# Patient Record
Sex: Male | Born: 1985 | Race: Black or African American | Hispanic: No | Marital: Single | Smoking: Current every day smoker
Health system: Southern US, Community
[De-identification: ages and names within clinical notes are randomized; demographics above are authoritative.]

## PROBLEM LIST (undated history)

## (undated) DIAGNOSIS — J45909 Unspecified asthma, uncomplicated: Secondary | ICD-10-CM

---
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
## 2006-02-02 ENCOUNTER — Emergency Department (HOSPITAL_COMMUNITY): Admission: EM | Admit: 2006-02-02 | Discharge: 2006-02-02 | Payer: Self-pay | Admitting: Family Medicine

## 2006-03-20 ENCOUNTER — Emergency Department (HOSPITAL_COMMUNITY): Admission: EM | Admit: 2006-03-20 | Discharge: 2006-03-20 | Payer: Self-pay | Admitting: Emergency Medicine

## 2007-06-02 ENCOUNTER — Emergency Department (HOSPITAL_COMMUNITY): Admission: EM | Admit: 2007-06-02 | Discharge: 2007-06-02 | Payer: Self-pay | Admitting: Emergency Medicine

## 2007-06-22 ENCOUNTER — Ambulatory Visit (HOSPITAL_COMMUNITY): Admission: RE | Admit: 2007-06-22 | Discharge: 2007-06-22 | Payer: Self-pay | Admitting: Orthopedic Surgery

## 2007-07-27 ENCOUNTER — Emergency Department (HOSPITAL_COMMUNITY): Admission: EM | Admit: 2007-07-27 | Discharge: 2007-07-27 | Payer: Self-pay | Admitting: Emergency Medicine

## 2008-07-22 ENCOUNTER — Emergency Department (HOSPITAL_COMMUNITY): Admission: EM | Admit: 2008-07-22 | Discharge: 2008-07-22 | Payer: Self-pay | Admitting: Emergency Medicine

## 2009-03-13 ENCOUNTER — Emergency Department (HOSPITAL_COMMUNITY): Admission: EM | Admit: 2009-03-13 | Discharge: 2009-03-13 | Payer: Self-pay | Admitting: Emergency Medicine

## 2009-03-15 ENCOUNTER — Emergency Department (HOSPITAL_COMMUNITY): Admission: EM | Admit: 2009-03-15 | Discharge: 2009-03-15 | Payer: Self-pay | Admitting: Emergency Medicine

## 2009-04-24 ENCOUNTER — Emergency Department (HOSPITAL_COMMUNITY): Admission: EM | Admit: 2009-04-24 | Discharge: 2009-04-24 | Payer: Self-pay | Admitting: Emergency Medicine

## 2010-04-26 LAB — GC/CHLAMYDIA PROBE AMP, GENITAL: GC Probe Amp, Genital: POSITIVE — AB
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
## 2010-07-08 ENCOUNTER — Emergency Department (HOSPITAL_COMMUNITY)
Admission: EM | Admit: 2010-07-08 | Discharge: 2010-07-08 | Disposition: A | Discharge status: Home / Self Care | Payer: Self-pay | Attending: Emergency Medicine | Admitting: Emergency Medicine

## 2010-07-08 DIAGNOSIS — H60399 Other infective otitis externa, unspecified ear: Secondary | ICD-10-CM | POA: Insufficient documentation

## 2010-07-11 ENCOUNTER — Emergency Department (HOSPITAL_COMMUNITY)
Admission: EM | Admit: 2010-07-11 | Discharge: 2010-07-11 | Disposition: A | Discharge status: Home / Self Care | Payer: Self-pay | Attending: Emergency Medicine | Admitting: Emergency Medicine

## 2010-07-11 DIAGNOSIS — L0201 Cutaneous abscess of face: Secondary | ICD-10-CM | POA: Insufficient documentation

## 2010-07-11 DIAGNOSIS — Z4801 Encounter for change or removal of surgical wound dressing: Secondary | ICD-10-CM | POA: Insufficient documentation

## 2010-07-11 DIAGNOSIS — L03211 Cellulitis of face: Secondary | ICD-10-CM | POA: Insufficient documentation

## 2010-10-14 LAB — BASIC METABOLIC PANEL
BUN: 10
CO2: 27
Calcium: 9.2
Creatinine, Ser: 1.25
GFR calc non Af Amer: >60
Glucose, Bld: 101 — ABNORMAL HIGH

## 2010-10-14 LAB — DIFFERENTIAL
Basophils Absolute: 0.1
Basophils Relative: 1
Eosinophils Absolute: 0.3
Eosinophils Relative: 2
Lymphocytes Relative: 38
Lymphs Abs: 4.1 — ABNORMAL HIGH
Monocytes Absolute: 0.8
Monocytes Relative: 8
Neutro Abs: 5.4
Neutrophils Relative %: 51

## 2010-10-14 LAB — BASIC METABOLIC PANEL WITH GFR
Chloride: 102
GFR calc Af Amer: >60
Potassium: 3.5
Sodium: 141

## 2010-10-14 LAB — POCT I-STAT, CHEM 8
BUN: 12
Calcium, Ion: 1.14
Chloride: 103
Creatinine, Ser: 1.5
Glucose, Bld: 101 — ABNORMAL HIGH
HCT: 45
Hemoglobin: 15.3
Potassium: 3.6
Sodium: 140
TCO2: 27

## 2010-10-14 LAB — CBC
HCT: 40.8
Hemoglobin: 14.1
MCHC: 34.5
MCV: 89.5
Platelets: 259
RBC: 4.55
RDW: 12.7
WBC: 10.6 — ABNORMAL HIGH

## 2010-10-15 LAB — CBC
HCT: 41.6
Platelets: 229
RBC: 4.71
WBC: 6.3

## 2010-10-15 LAB — BASIC METABOLIC PANEL
BUN: 11
Creatinine, Ser: 1.2
GFR calc Af Amer: >60
GFR calc non Af Amer: >60
Potassium: 3.9

## 2010-10-15 LAB — URINALYSIS, ROUTINE W REFLEX MICROSCOPIC
Bilirubin Urine: NEGATIVE
Hgb urine dipstick: NEGATIVE
Ketones, ur: NEGATIVE
Protein, ur: NEGATIVE
Urobilinogen, UA: 0.2

## 2010-10-15 LAB — DIFFERENTIAL
Lymphocytes Relative: 25
Lymphs Abs: 1.6
Neutrophils Relative %: 65

## 2010-10-15 LAB — TYPE AND SCREEN
ABO/RH(D): A NEG
Antibody Screen: NEGATIVE

## 2010-10-15 LAB — PROTIME-INR
INR: 0.9
Prothrombin Time: 12.8

## 2010-10-15 LAB — ABO/RH: ABO/RH(D): A NEG

## 2010-10-22 ENCOUNTER — Emergency Department (HOSPITAL_COMMUNITY)
Admission: EM | Admit: 2010-10-22 | Discharge: 2010-10-22 | Disposition: A | Discharge status: Home / Self Care | Payer: BC Managed Care – PPO | Attending: Emergency Medicine | Admitting: Emergency Medicine

## 2010-10-22 DIAGNOSIS — H60399 Other infective otitis externa, unspecified ear: Secondary | ICD-10-CM | POA: Insufficient documentation

## 2010-10-22 DIAGNOSIS — L0390 Cellulitis, unspecified: Secondary | ICD-10-CM | POA: Insufficient documentation

## 2010-10-22 DIAGNOSIS — L0291 Cutaneous abscess, unspecified: Secondary | ICD-10-CM | POA: Insufficient documentation

## 2011-09-07 ENCOUNTER — Encounter (HOSPITAL_COMMUNITY): Payer: Self-pay | Admitting: *Deleted

## 2011-09-07 ENCOUNTER — Emergency Department (INDEPENDENT_AMBULATORY_CARE_PROVIDER_SITE_OTHER)
Admission: EM | Admit: 2011-09-07 | Discharge: 2011-09-07 | Disposition: A | Discharge status: Home / Self Care | Payer: Self-pay | Source: Home / Self Care

## 2011-09-07 DIAGNOSIS — R52 Pain, unspecified: Secondary | ICD-10-CM

## 2011-09-07 DIAGNOSIS — L0291 Cutaneous abscess, unspecified: Secondary | ICD-10-CM

## 2011-09-07 HISTORY — DX: Unspecified asthma, uncomplicated: J45.909

## 2011-09-07 MED ORDER — IBUPROFEN 800 MG PO TABS: 800.0000 mg | ORAL_TABLET | Freq: Three times a day (TID) | ORAL | Status: AC

## 2011-09-07 MED ORDER — IBUPROFEN 800 MG PO TABS
ORAL_TABLET | ORAL | Status: AC
  Filled 2011-09-07: qty 1

## 2011-09-07 MED ORDER — IBUPROFEN 800 MG PO TABS
800.0000 mg | ORAL_TABLET | Freq: Once | ORAL | Status: AC
  Administered 2011-09-07: 800 mg via ORAL

## 2011-09-07 MED ORDER — CEPHALEXIN 500 MG PO CAPS: 500.0000 mg | ORAL_CAPSULE | Freq: Two times a day (BID) | ORAL | Status: AC

## 2011-09-07 NOTE — ED Provider Notes (Addendum)
History     CSN: [id number]  Arrival date & time 09/07/11  1108   None     Chief Complaint  Patient presents with  . Abscess    (Consider location/radiation/quality/duration/timing/severity/associated sxs/prior treatment) Patient is a 26 y.o. male presenting with abscess. The history is provided by the patient.  Abscess   This patient presents for evaluation of a cutaneous abscess.  The lesion is located in the right face.  Onset was 2 days ago.  Symptoms have worsen.  Abscess has associated symptoms of pain.  The patient does have previous history of cutaneous abscesses.  There is no known previous history of MRSA.   They do not have a history of diabetes. Has applied warm compresses and alcohol with no improvement.  Has had I&D of abscess in same area 6 months ago.    Past Medical History  Diagnosis Date  . Asthma     History reviewed. No pertinent past surgical history.  Family History  Problem Relation Age of Onset  . Asthma Maternal Aunt     History  Substance Use Topics  . Smoking status: Current Everyday Smoker  . Smokeless tobacco: Not on file  . Alcohol Use: Yes      Review of Systems  Constitutional: Negative.   Respiratory: Negative.   Cardiovascular: Negative.   Skin: Positive for wound. Negative for color change, pallor and rash.    Allergies  Review of patient's allergies indicates no known allergies.  Home Medications   Current Outpatient Rx  Name Route Sig Dispense Refill  . CEPHALEXIN 500 MG PO CAPS Oral Take 1 capsule (500 mg total) by mouth 2 (two) times daily. 20 capsule 0  . IBUPROFEN 800 MG PO TABS Oral Take 1 tablet (800 mg total) by mouth 3 (three) times daily. 21 tablet 0    BP 116/80  Pulse 80  Temp 98 F (36.7 C) (Oral)  Resp 14  SpO2 99%  Physical Exam  Nursing note and vitals reviewed. Constitutional: He is oriented to person, place, and time. Vital signs are normal. He appears well-developed and well-nourished.  He is active and cooperative.  HENT:  Head: Normocephalic.    Right Ear: Hearing, tympanic membrane, external ear and ear canal normal.  Left Ear: Hearing, tympanic membrane, external ear and ear canal normal.  Nose: Nose normal.  Mouth/Throat: Uvula is midline, oropharynx is clear and moist and mucous membranes are normal.       Abscess, approximately 2cm, fluctuant   Eyes: Conjunctivae are normal. Pupils are equal, round, and reactive to light. No scleral icterus.  Neck: Trachea normal. Neck supple.  Cardiovascular: Normal rate, regular rhythm, normal heart sounds and normal pulses.   Pulmonary/Chest: Effort normal and breath sounds normal.  Lymphadenopathy:       Head (right side): No submental, no submandibular, no tonsillar, no preauricular, no posterior auricular and no occipital adenopathy present.       Head (left side): No submental, no submandibular, no tonsillar, no preauricular, no posterior auricular and no occipital adenopathy present.    He has no cervical adenopathy.  Neurological: He is alert and oriented to person, place, and time. No cranial nerve deficit or sensory deficit.  Skin: Skin is warm and dry.  Psychiatric: He has a normal mood and affect. His speech is normal and behavior is normal. Judgment and thought content normal. Cognition and memory are normal.    ED Course  INCISION AND DRAINAGE Date/Time: 09/07/2011 12:11 PM Performed by: Nigel Mormon  Abbygale Lapid L Authorized by: Johnsie Kindred Consent: Verbal consent obtained. Risks and benefits: risks, benefits and alternatives were discussed Consent given by: patient Patient understanding: patient states understanding of the procedure being performed Patient consent: the patient's understanding of the procedure matches consent given Procedure consent: procedure consent matches procedure scheduled Relevant documents: relevant documents present and verified Patient identity confirmed: verbally with patient and arm  band Time out: Immediately prior to procedure a "time out" was called to verify the correct patient, procedure, equipment, support staff and site/side marked as required. Type: abscess Body area: head/neck Location details: face Anesthesia: local infiltration Local anesthetic: lidocaine 1% without epinephrine Anesthetic total: 2.5 ml Patient sedated: no Scalpel size: 11 Needle gauge: 22 Incision type: single straight (crosscut) Complexity: simple Drainage: purulent and serous Drainage amount: moderate Wound treatment: wound left open Packing material: none Patient tolerance: Patient tolerated the procedure well with no immediate complications.   (including critical care time)   Labs Reviewed  CULTURE, ROUTINE-ABSCESS   No results found.   1. Abscess   2. Pain       MDM  RTC in 4 days for wound recheck.    You have had an abscess drained.  An abscess is a collection of pus caused by infection with skin bacteria such as Streptococcus or Staphylococcus.    For the first 2 days, leave the dressing in place and keep it clean and dry.  If the abscess was packed, we may instruct you to come back in 2 days to have the packing removed and maybe replaced.  If the abscess was not packed, you may remove the dressing yourself in 2 days and take care of the wound yourself.  Finish up the prescription of any antibiotics that you have been given.  Take infectious precautions since the bacteria that cause these abscesses may be contagious.  Wash hands frequently or use hand sanitizer, especially after touching the abscess area or changing dressings.  Do not allow anyone else to use your towel or washcloth and wash these items after each use until the abscess has healed.  You may want to use an antibacterial soap such as Dial or a prescription like Hibiclens.  You also may want to consider spraying the tub or shower with a disinfectant such a Lysol until the abscess has healed.  Things  that should prompt you to return to the office for a recheck include:  Fever over 100 degrees, increasing pain or drainage, failure of the abscess to heal after 10 days, or other skin lesions elsewhere.        Johnsie Kindred, NP 09/07/11 1249    09/14/11 11:10 culture results show MRSA, attempted to call patient to change antibiotics, numbers listed were not working numbers, information given to [facility] for follow up.  Johnsie Kindred, NP 09/14/11 1112

## 2011-09-07 NOTE — ED Notes (Signed)
Dressing done by provider

## 2011-09-07 NOTE — ED Notes (Signed)
Pt reports painful, redness and swelling under his right ear the last week.

## 2011-09-07 NOTE — ED Provider Notes (Signed)
Medical screening examination/treatment/procedure(s) were performed by non-physician practitioner and as supervising physician I was immediately available for consultation/collaboration.  Jabaree Mercado, M.D.   Dereonna Lensing C Leroy Trim, MD 09/07/11 2017 

## 2011-09-07 NOTE — Discharge Instructions (Signed)
You have had an abscess drained.  An abscess is a collection of pus caused by infection with skin bacteria such as Streptococcus or Staphylococcus.    For the first 2 days, leave the dressing in place and keep it clean and dry.  If the abscess was packed, we may instruct you to come back in 2 days to have the packing removed and maybe replaced.  If the abscess was not packed, you may remove the dressing yourself in 2 days and take care of the wound yourself.  Finish up the prescription of any antibiotics that you have been given.  Take infectious precautions since the bacteria that cause these abscesses may be contagious.  Wash hands frequently or use hand sanitizer, especially after touching the abscess area or changing dressings.  Do not allow anyone else to use your towel or washcloth and wash these items after each use until the abscess has healed.  You may want to use an antibacterial soap such as Dial or a prescription like Hibiclens.  You also may want to consider spraying the tub or shower with a disinfectant such a Lysol until the abscess has healed.  Things that should prompt you to return to the office for a recheck include:  Fever over 100 degrees, increasing pain or drainage, failure of the abscess to heal after 10 days, or other skin lesions elsewhere.   Abscess An abscess (boil or furuncle) is an infected area under your skin. This area is filled with yellowish white fluid (pus). HOME CARE   Only take medicine as told by your doctor.   Keep the skin clean around your abscess. Keep clothes that may touch the abscess clean.   Change any bandages (dressings) as told by your doctor.   Avoid direct skin contact with other people. The infection can spread by skin contact with others.   Practice good hygiene and do not share personal care items.   Do not share athletic equipment, towels, or whirlpools. Shower after every practice or work out session.   If a draining area cannot be  covered:   Do not play sports.   Children should not go to daycare until the wound has healed or until fluid (drainage) stops coming out of the wound.   See your doctor for a follow-up visit as told.  GET HELP RIGHT AWAY IF:   There is more pain, puffiness (swelling), and redness in the wound site.   There is fluid or bleeding from the wound site.   You have muscle aches, chills, fever, or feel sick.   You or your child has a temperature by mouth above 102 F (38.9 C), not controlled by medicine.   Your baby is older than 3 months with a rectal temperature of 102 F (38.9 C) or higher.  MAKE SURE YOU:   Understand these instructions.   Will watch your condition.   Will get help right away if you are not doing well or get worse.  Document Released: 06/23/2007 Document Revised: 12/24/2010 Document Reviewed: 06/23/2007 [facility] Patient Information 2012 [place], [place].Skin Infections A skin infection usually develops as a result of disruption of the skin barrier.  CAUSES  A skin infection might occur following:  Trauma or an injury to the skin such as a cut or insect sting.   Inflammation (as in eczema).   Breaks in the skin between the toes (as in athlete's foot).   Swelling (edema).  SYMPTOMS  The legs are the most common site  affected. Usually there is:  Redness.   Swelling.   Pain.   There may be red streaks in the area of the infection.  TREATMENT   Minor skin infections may be treated with topical antibiotics, but if the skin infection is severe, hospital care and intravenous (IV) antibiotic treatment may be needed.   Most often skin infections can be treated with oral antibiotic medicine as well as proper rest and elevation of the affected area until the infection improves.   If you are prescribed oral antibiotics, it is important to take them as directed and to take all the pills even if you feel better before you have finished all of the medicine.   You  may apply warm compresses to the area for 20-30 minutes 4 times daily.  You might need a tetanus shot now if:  You have no idea when you had the last one.   You have never had a tetanus shot before.   Your wound had dirt in it.  If you need a tetanus shot and you decide not to get one, there is a rare chance of getting tetanus. Sickness from tetanus can be serious. If you get a tetanus shot, your arm may swell and become red and warm at the shot site. This is common and not a problem. SEEK MEDICAL CARE IF:  The pain and swelling from your infection do not improve within 2 days.  SEEK IMMEDIATE MEDICAL CARE IF:  You develop a fever, chills, or other serious problems.  Document Released: 02/12/2004 Document Revised: 12/24/2010 Document Reviewed: 12/25/2007 [facility] Patient Information 2012 [place], [place].

## 2011-09-11 LAB — CULTURE, ROUTINE-ABSCESS

## 2011-09-14 ENCOUNTER — Telehealth (HOSPITAL_COMMUNITY): Payer: Self-pay | Admitting: *Deleted

## 2011-09-14 NOTE — ED Provider Notes (Signed)
Medical screening examination/treatment/procedure(s) were performed by non-physician practitioner and as supervising physician I was immediately available for consultation/collaboration.  Leslee Home, M.D.   Reuben Likes, MD 09/14/11 [phone]

## 2011-09-14 NOTE — ED Notes (Signed)
Abscess culture R face: Mod. MRSA.  Pt. treated with Keflex.  Porfirio Mylar reviewed the lab and changed Rx. to Doxycycline 100 mg. PO BID x 10 days. She attempted to contact pt. and said both numbers are not valid.  I called pt.'s contact mother and left a message for pt. to call. Vassie Moselle 09/14/2011

## 2011-09-20 ENCOUNTER — Telehealth (HOSPITAL_COMMUNITY): Payer: Self-pay | Admitting: *Deleted

## 2011-09-20 NOTE — ED Notes (Signed)
I left a message on Mom's phone for pt. to call.  Call 2. Shane Logan 09/20/2011

## 2011-09-23 ENCOUNTER — Telehealth (HOSPITAL_COMMUNITY): Payer: Self-pay | Admitting: *Deleted

## 2011-09-23 NOTE — ED Notes (Signed)
Mom contacted today. She said she did not get my other 2 messages.  States her son's phone is turned off.  She will give him a message to call tomorrow after 1130.

## 2011-09-28 ENCOUNTER — Telehealth (HOSPITAL_COMMUNITY): Payer: Self-pay | Admitting: *Deleted

## 2011-09-28 NOTE — ED Notes (Signed)
Unable to contact pt. by phone.  Letter sent asking pt. to call for further treatment. Shane Logan 09/28/2011

## 2013-09-12 ENCOUNTER — Emergency Department (HOSPITAL_COMMUNITY)
Admission: EM | Admit: 2013-09-12 | Discharge: 2013-09-12 | Disposition: A | Discharge status: Home / Self Care | Payer: No Typology Code available for payment source | Attending: Family Medicine | Admitting: Family Medicine

## 2013-09-12 ENCOUNTER — Encounter (HOSPITAL_COMMUNITY): Payer: Self-pay | Admitting: Emergency Medicine

## 2013-09-12 DIAGNOSIS — J45909 Unspecified asthma, uncomplicated: Secondary | ICD-10-CM | POA: Insufficient documentation

## 2013-09-12 DIAGNOSIS — S239XXA Sprain of unspecified parts of thorax, initial encounter: Secondary | ICD-10-CM | POA: Diagnosis not present

## 2013-09-12 DIAGNOSIS — Y9389 Activity, other specified: Secondary | ICD-10-CM | POA: Insufficient documentation

## 2013-09-12 DIAGNOSIS — S134XXA Sprain of ligaments of cervical spine, initial encounter: Secondary | ICD-10-CM

## 2013-09-12 DIAGNOSIS — IMO0002 Reserved for concepts with insufficient information to code with codable children: Secondary | ICD-10-CM | POA: Diagnosis present

## 2013-09-12 DIAGNOSIS — Y92410 Unspecified street and highway as the place of occurrence of the external cause: Secondary | ICD-10-CM | POA: Insufficient documentation

## 2013-09-12 DIAGNOSIS — F17200 Nicotine dependence, unspecified, uncomplicated: Secondary | ICD-10-CM | POA: Insufficient documentation

## 2013-09-12 MED ORDER — IBUPROFEN 800 MG PO TABS: 800.0000 mg | ORAL_TABLET | Freq: Three times a day (TID) | ORAL | Status: DC | PRN

## 2013-09-12 MED ORDER — CYCLOBENZAPRINE HCL 10 MG PO TABS: 10.0000 mg | ORAL_TABLET | Freq: Three times a day (TID) | ORAL | Status: AC | PRN

## 2013-09-12 MED ORDER — HYDROCODONE-ACETAMINOPHEN 5-325 MG PO TABS: 1.0000 | ORAL_TABLET | Freq: Four times a day (QID) | ORAL | Status: DC | PRN
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
## 2013-09-12 NOTE — Discharge Instructions (Signed)
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
## 2013-09-13 NOTE — ED Provider Notes (Signed)
Medical screening examination/treatment/procedure(s) were performed by a resident physician or non-physician practitioner and as the supervising physician I was immediately available for consultation/collaboration.  Madiha Bambrick, MD Family Medicine   Madelyne Millikan J Joban Colledge, MD 09/13/13 1138 

## 2013-12-06 ENCOUNTER — Emergency Department (HOSPITAL_COMMUNITY)
Admission: EM | Admit: 2013-12-06 | Discharge: 2013-12-06 | Disposition: A | Discharge status: Home / Self Care | Payer: Self-pay | Attending: Emergency Medicine | Admitting: Emergency Medicine

## 2013-12-06 ENCOUNTER — Emergency Department (HOSPITAL_COMMUNITY): Payer: Self-pay

## 2013-12-06 ENCOUNTER — Encounter (HOSPITAL_COMMUNITY): Payer: Self-pay | Admitting: Emergency Medicine

## 2013-12-06 DIAGNOSIS — J45909 Unspecified asthma, uncomplicated: Secondary | ICD-10-CM | POA: Insufficient documentation

## 2013-12-06 DIAGNOSIS — Z720 Tobacco use: Secondary | ICD-10-CM | POA: Insufficient documentation

## 2013-12-06 DIAGNOSIS — R52 Pain, unspecified: Secondary | ICD-10-CM

## 2013-12-06 DIAGNOSIS — N451 Epididymitis: Secondary | ICD-10-CM | POA: Insufficient documentation

## 2013-12-06 LAB — URINALYSIS, ROUTINE W REFLEX MICROSCOPIC
Bilirubin Urine: NEGATIVE
GLUCOSE, UA: NEGATIVE mg/dL
HGB URINE DIPSTICK: NEGATIVE
KETONES UR: NEGATIVE mg/dL
LEUKOCYTES UA: NEGATIVE
Nitrite: NEGATIVE
PH: 6.5 (ref 5.0–8.0)
PROTEIN: NEGATIVE mg/dL
Specific Gravity, Urine: 1.021 (ref 1.005–1.030)
Urobilinogen, UA: 1 mg/dL (ref 0.0–1.0)

## 2013-12-06 MED ORDER — HYDROCODONE-ACETAMINOPHEN 5-325 MG PO TABS: 1.0000 | ORAL_TABLET | Freq: Four times a day (QID) | ORAL | Status: DC | PRN

## 2013-12-06 MED ORDER — CEFTRIAXONE SODIUM 250 MG IJ SOLR
250.0000 mg | Freq: Once | INTRAMUSCULAR | Status: AC
  Administered 2013-12-06: 250 mg via INTRAMUSCULAR
  Filled 2013-12-06: qty 250

## 2013-12-06 MED ORDER — DOXYCYCLINE HYCLATE 100 MG PO TABS
100.0000 mg | ORAL_TABLET | Freq: Once | ORAL | Status: AC
  Administered 2013-12-06: 100 mg via ORAL
  Filled 2013-12-06 (×2): qty 1

## 2013-12-06 MED ORDER — DOXYCYCLINE HYCLATE 100 MG PO CAPS
100.0000 mg | ORAL_CAPSULE | Freq: Two times a day (BID) | ORAL | Status: DC
Start: 2013-12-06 — End: 2015-02-21

## 2013-12-06 NOTE — ED Notes (Signed)
Pt reports right scrotum swelling and pain (7/10). Denies injury.

## 2013-12-06 NOTE — ED Provider Notes (Addendum)
CSN: [id number]     Arrival date & time 12/06/13  1219 History   First MD Initiated Contact with Patient 12/06/13 1357     Chief Complaint  Patient presents with  . Groin Swelling    right     (Consider location/radiation/quality/duration/timing/severity/associated sxs/prior Treatment) Patient is a 28 y.o. male presenting with testicular pain. The history is provided by the patient.  Testicle Pain This is a new problem. The current episode started yesterday. The problem occurs constantly. The problem has not changed since onset.Associated symptoms comments: Pain and swelling of the right testicle starting yesterday.  No fever, abd pain, discharge, dysuria or penile pain/rashes. The symptoms are aggravated by walking. Nothing relieves the symptoms. He has tried nothing for the symptoms. The treatment provided no relief.    Past Medical History  Diagnosis Date  . Asthma    No past surgical history on file. Family History  Problem Relation Age of Onset  . Asthma Maternal Aunt    History  Substance Use Topics  . Smoking status: Current Every Day Smoker  . Smokeless tobacco: Not on file  . Alcohol Use: Yes    Review of Systems  Genitourinary: Positive for testicular pain.  All other systems reviewed and are negative.     Allergies  Review of patient's allergies indicates no known allergies.  Home Medications   Prior to Admission medications   Medication Sig Start Date End Date Taking? Authorizing Provider  ibuprofen (ADVIL,MOTRIN) 800 MG tablet Take 1 tablet (800 mg total) by mouth 3 (three) times daily as needed. 09/12/13  Yes Graylon GoodZachary H Baker, PA-C  cyclobenzaprine (FLEXERIL) 10 MG tablet Take 1 tablet (10 mg total) by mouth 3 (three) times daily as needed for muscle spasms. Patient not taking: Reported on 12/06/2013 09/12/13   Graylon GoodZachary H Baker, PA-C  HYDROcodone-acetaminophen (NORCO) 5-325 MG per tablet Take 1 tablet by mouth every 6 (six) hours as needed for moderate  pain. Patient not taking: Reported on 12/06/2013 09/12/13   Graylon GoodZachary H Baker, PA-C   BP 134/93 mmHg  Pulse 77  Temp(Src) 97.8 F (36.6 C) (Oral)  Resp 21  SpO2 99% Physical Exam  Constitutional: He is oriented to person, place, and time. He appears well-developed and well-nourished. No distress.  HENT:  Head: Normocephalic and atraumatic.  Cardiovascular: Normal rate.   Pulmonary/Chest: Effort normal.  Abdominal: Soft. He exhibits no distension. There is no tenderness. There is no rebound and no guarding.  Genitourinary: Penis normal.    Right testis shows swelling and tenderness. Left testis shows no mass, no swelling and no tenderness. Left testis is descended. Cremasteric reflex is not absent on the left side.  Neurological: He is alert and oriented to person, place, and time.  Skin: Skin is warm and dry.  Psychiatric: He has a normal mood and affect. His behavior is normal.  Nursing note and vitals reviewed.   ED Course  Procedures (including critical care time) Labs Review Labs Reviewed  URINALYSIS, ROUTINE W REFLEX MICROSCOPIC    Imaging Review [place] Scrotum  12/06/2013   CLINICAL DATA:  Right-sided testicular pain and swelling  EXAM: SCROTAL ULTRASOUND  DOPPLER ULTRASOUND OF THE TESTICLES  TECHNIQUE: Complete ultrasound examination of the testicles, epididymis, and other scrotal structures was performed. Color and spectral Doppler ultrasound were also utilized to evaluate blood flow to the testicles.  COMPARISON:  None.  FINDINGS: Right testicle  Measurements: 5.4 x 2.7 x 3.5 cm. No mass identified. Microlithiasis noted.  Left testicle  Measurements: 4.1 x 2.0 x 4.0 cm. No mass identified. Microlithiasis noted.  Right epididymis: Mild swelling and hyperemia seen involving tail of right epididymis. This is suspicious for mild epididymitis.  Left epididymis:  Normal in size and appearance.  Hydrocele:  None visualized.  Varicocele:  Small bilateral varicoceles noted.  Pulsed  Doppler interrogation of both testes demonstrates low resistance arterial and venous waveforms bilaterally. Color Doppler ultrasound shows increased blood flow within the right testicle compared to the left testicle.  IMPRESSION: No evidence of testicular mass or torsion.  Findings suspicious for mild right-sided epididymitis. Possible right-sided orchitis versus reactive hyperemia.  Bilateral testicular microlithiasis. Current literature suggests that testicular microlithiasis is not a significant independent risk factor for development of testicular carcinoma, and that follow up imaging is not warranted in the absence of other risk factors. Monthly testicular self-examination and annual physical exams are considered appropriate surveillance. If patient has other risk factors for testicular carcinoma, then referral to Urology should be considered. (Reference: DeCastro, et al.: A 5-Year Follow up Study of Asymptomatic Men with Testicular Microlithiasis. J Urol 2008; 179:1420-1423.)   Electronically Signed   By: Myles RosenthalJohn  Stahl M.D.   On: 12/06/2013 15:23   [place] Art/ven Flow Abd Pelv Doppler  12/06/2013   CLINICAL DATA:  Right-sided testicular pain and swelling  EXAM: SCROTAL ULTRASOUND  DOPPLER ULTRASOUND OF THE TESTICLES  TECHNIQUE: Complete ultrasound examination of the testicles, epididymis, and other scrotal structures was performed. Color and spectral Doppler ultrasound were also utilized to evaluate blood flow to the testicles.  COMPARISON:  None.  FINDINGS: Right testicle  Measurements: 5.4 x 2.7 x 3.5 cm. No mass identified. Microlithiasis noted.  Left testicle  Measurements: 4.1 x 2.0 x 4.0 cm. No mass identified. Microlithiasis noted.  Right epididymis: Mild swelling and hyperemia seen involving tail of right epididymis. This is suspicious for mild epididymitis.  Left epididymis:  Normal in size and appearance.  Hydrocele:  None visualized.  Varicocele:  Small bilateral varicoceles noted.  Pulsed Doppler  interrogation of both testes demonstrates low resistance arterial and venous waveforms bilaterally. Color Doppler ultrasound shows increased blood flow within the right testicle compared to the left testicle.  IMPRESSION: No evidence of testicular mass or torsion.  Findings suspicious for mild right-sided epididymitis. Possible right-sided orchitis versus reactive hyperemia.  Bilateral testicular microlithiasis. Current literature suggests that testicular microlithiasis is not a significant independent risk factor for development of testicular carcinoma, and that follow up imaging is not warranted in the absence of other risk factors. Monthly testicular self-examination and annual physical exams are considered appropriate surveillance. If patient has other risk factors for testicular carcinoma, then referral to Urology should be considered. (Reference: DeCastro, et al.: A 5-Year Follow up Study of Asymptomatic Men with Testicular Microlithiasis. J Urol 2008; 179:1420-1423.)   Electronically Signed   By: Myles RosenthalJohn  Stahl M.D.   On: 12/06/2013 15:23     EKG Interpretation None      MDM   Final diagnoses:  Epididymitis    Patient with symptoms consistent with epididymitis today. Low suspicion for torsion. Scrotal ultrasound pending. Patient denies any sexual activity in the last 1-2 months. He denies any penile discharge or rashes.  3:35 PM Ultrasound consistent with right epididymitis. Will treat with doxycycline and rocephin.  Gwyneth SproutWhitney Britt Theard, MD 12/06/13 1537  Gwyneth SproutWhitney Inetta Dicke, MD 12/06/13 1540  Gwyneth SproutWhitney Joanna Hall, MD 12/06/13 1545

## 2015-02-21 ENCOUNTER — Encounter (HOSPITAL_COMMUNITY): Payer: Self-pay

## 2015-02-21 ENCOUNTER — Emergency Department (HOSPITAL_COMMUNITY)
Admission: EM | Admit: 2015-02-21 | Discharge: 2015-02-21 | Disposition: A | Discharge status: Home / Self Care | Payer: Self-pay | Attending: Physician Assistant | Admitting: Physician Assistant

## 2015-02-21 DIAGNOSIS — R110 Nausea: Secondary | ICD-10-CM | POA: Insufficient documentation

## 2015-02-21 DIAGNOSIS — R1013 Epigastric pain: Secondary | ICD-10-CM | POA: Insufficient documentation

## 2015-02-21 DIAGNOSIS — F17200 Nicotine dependence, unspecified, uncomplicated: Secondary | ICD-10-CM | POA: Insufficient documentation

## 2015-02-21 DIAGNOSIS — J45909 Unspecified asthma, uncomplicated: Secondary | ICD-10-CM | POA: Insufficient documentation

## 2015-02-21 LAB — COMPREHENSIVE METABOLIC PANEL
ALT: 44 U/L (ref 17–63)
ANION GAP: 10 (ref 5–15)
AST: 35 U/L (ref 15–41)
Albumin: 4.5 g/dL (ref 3.5–5.0)
Alkaline Phosphatase: 84 U/L (ref 38–126)
BUN: 7 mg/dL (ref 6–20)
CHLORIDE: 105 mmol/L (ref 101–111)
CO2: 24 mmol/L (ref 22–32)
CREATININE: 1.34 mg/dL — AB (ref 0.61–1.24)
Calcium: 9.6 mg/dL (ref 8.9–10.3)
Glucose, Bld: 107 mg/dL — ABNORMAL HIGH (ref 65–99)
POTASSIUM: 3.9 mmol/L (ref 3.5–5.1)
SODIUM: 139 mmol/L (ref 135–145)
Total Bilirubin: 1.7 mg/dL — ABNORMAL HIGH (ref 0.3–1.2)
Total Protein: 8 g/dL (ref 6.5–8.1)

## 2015-02-21 LAB — LIPASE, BLOOD: LIPASE: 43 U/L (ref 11–51)

## 2015-02-21 LAB — CBC
HEMATOCRIT: 45.7 % (ref 39.0–52.0)
HEMOGLOBIN: 15 g/dL (ref 13.0–17.0)
MCH: 30.5 pg (ref 26.0–34.0)
MCHC: 32.8 g/dL (ref 30.0–36.0)
MCV: 92.9 fL (ref 78.0–100.0)
PLATELETS: 304 10*3/uL (ref 150–400)
RBC: 4.92 MIL/uL (ref 4.22–5.81)
RDW: 12.7 % (ref 11.5–15.5)
WBC: 9.9 10*3/uL (ref 4.0–10.5)

## 2015-02-21 MED ORDER — PANTOPRAZOLE SODIUM 40 MG IV SOLR
40.0000 mg | INTRAVENOUS | Status: AC
  Administered 2015-02-21: 40 mg via INTRAVENOUS
  Filled 2015-02-21: qty 40

## 2015-02-21 MED ORDER — OMEPRAZOLE 20 MG PO CPDR: 20.0000 mg | DELAYED_RELEASE_CAPSULE | Freq: Every day | ORAL | Status: AC

## 2015-02-21 MED ORDER — GI COCKTAIL ~~LOC~~
30.0000 mL | Freq: Once | ORAL | Status: AC
Start: 2015-02-21 — End: 2015-02-21
  Administered 2015-02-21: 30 mL via ORAL
  Filled 2015-02-21: qty 30

## 2015-02-21 MED ORDER — SUCRALFATE 1 GM/10ML PO SUSP: 1.0000 g | Freq: Three times a day (TID) | ORAL | Status: AC

## 2015-02-21 NOTE — Discharge Instructions (Signed)
Peptic Ulcer ° °A peptic ulcer is a sore in the lining of your esophagus (esophageal ulcer), stomach (gastric ulcer), or in the first part of your small intestine (duodenal ulcer). The ulcer causes erosion into the deeper tissue. °CAUSES  °Normally, the lining of the stomach and the small intestine protects itself from the acid that digests food. The protective lining can be damaged by: °· An infection caused by a bacterium called Helicobacter pylori (H. pylori). °· Regular use of nonsteroidal anti-inflammatory drugs (NSAIDs), such as ibuprofen or aspirin. °· Smoking tobacco. °Other risk factors include being older than 50, drinking alcohol excessively, and having a family history of ulcer disease.  °SYMPTOMS  °· Burning pain or gnawing in the area between the chest and the belly button. °· Heartburn. °· Nausea and vomiting. °· Bloating. °The pain can be worse on an empty stomach and at night. If the ulcer results in bleeding, it can cause: °· Black, tarry stools. °· Vomiting of bright red blood. °· Vomiting of coffee-ground-looking materials. °DIAGNOSIS  °A diagnosis is usually made based upon your history and an exam. Other tests and procedures may be performed to find the cause of the ulcer. Finding a cause will help determine the best treatment. Tests and procedures may include: °· Blood tests, stool tests, or breath tests to check for the bacterium H. pylori. °· An upper gastrointestinal (GI) series of the esophagus, stomach, and small intestine. °· An endoscopy to examine the esophagus, stomach, and small intestine. °· A biopsy. °TREATMENT  °Treatment may include: °· Eliminating the cause of the ulcer, such as smoking, NSAIDs, or alcohol. °· Medicines to reduce the amount of acid in your digestive tract. °· Antibiotic medicines if the ulcer is caused by the H. pylori bacterium. °· An upper endoscopy to treat a bleeding ulcer. °· Surgery if the bleeding is severe or if the ulcer created a hole somewhere in the  digestive system. °HOME CARE INSTRUCTIONS  °· Avoid tobacco, alcohol, and caffeine. Smoking can increase the acid in the stomach, and continued smoking will impair the healing of ulcers. °· Avoid foods and drinks that seem to cause discomfort or aggravate your ulcer. °· Only take medicines as directed by your caregiver. Do not substitute over-the-counter medicines for prescription medicines without talking to your caregiver. °· Keep any follow-up appointments and tests as directed. °SEEK MEDICAL CARE IF:  °· Your do not improve within 7 days of starting treatment. °· You have ongoing indigestion or heartburn. °SEEK IMMEDIATE MEDICAL CARE IF:  °· You have sudden, sharp, or persistent abdominal pain. °· You have bloody or dark black, tarry stools. °· You vomit blood or vomit that looks like coffee grounds. °· You become light-headed, weak, or feel faint. °· You become sweaty or clammy. °MAKE SURE YOU:  °· Understand these instructions. °· Will watch your condition. °· Will get help right away if you are not doing well or get worse. °  °This information is not intended to replace advice given to you by your health care provider. Make sure you discuss any questions you have with your health care provider. °  °Document Released: 01/02/2000 Document Revised: 01/25/2014 Document Reviewed: 08/04/2011 °Elsevier Interactive Patient Education ©2016 [employer] ° °

## 2015-02-21 NOTE — ED Provider Notes (Signed)
CSN: [id number]     Arrival date & time 02/21/15  1502 History   First MD Initiated Contact with Patient 02/21/15 1515     Chief Complaint  Patient presents with  . Abdominal Pain     (Consider location/radiation/quality/duration/timing/severity/associated sxs/prior Treatment) HPI Comments: Patient presents to the ED with a chief complaint of epigastric abdominal pain.  He states that he has been having the pain intermittently for several months.  States that the pain is worsened after eating spicy foods.  States that he has associated heartburn.  He denies any fevers, chills, vomiting, diarrhea, or constipation.  States that he does have some occasional nausea. Has used pepto bismol with good relief.  Additionally, patient states that he is a heavy drinker, usually drinking 1 bottle of liquor per day.  He denies any prior abdominal surgeries.  The history is provided by the patient. No language interpreter was used.    Past Medical History  Diagnosis Date  . Asthma    History reviewed. No pertinent past surgical history. Family History  Problem Relation Age of Onset  . Asthma Maternal Aunt    Social History  Substance Use Topics  . Smoking status: Current Every Day Smoker  . Smokeless tobacco: None  . Alcohol Use: Yes    Review of Systems  Constitutional: Negative for fever and chills.  Respiratory: Negative for shortness of breath.   Cardiovascular: Negative for chest pain.  Gastrointestinal: Positive for abdominal pain. Negative for nausea, vomiting, diarrhea and constipation.  Genitourinary: Negative for dysuria.  All other systems reviewed and are negative.     Allergies  Review of patient's allergies indicates no known allergies.  Home Medications   Prior to Admission medications   Medication Sig Start Date End Date Taking? Authorizing Provider  cyclobenzaprine (FLEXERIL) 10 MG tablet Take 1 tablet (10 mg total) by mouth 3 (three) times daily as needed for muscle  spasms. Patient not taking: Reported on 12/06/2013 09/12/13   Graylon Good, PA-C   BP 141/103 mmHg  Pulse 100  Temp(Src) 97.8 F (36.6 C) (Oral)  Resp 16  SpO2 100% Physical Exam  Constitutional: He is oriented to person, place, and time. He appears well-developed and well-nourished.  HENT:  Head: Normocephalic and atraumatic.  Eyes: Conjunctivae and EOM are normal. Pupils are equal, round, and reactive to light. Right eye exhibits no discharge. Left eye exhibits no discharge. No scleral icterus.  Neck: Normal range of motion. Neck supple. No JVD present.  Cardiovascular: Normal rate, regular rhythm and normal heart sounds.  Exam reveals no gallop and no friction rub.   No murmur heard. Pulmonary/Chest: Effort normal and breath sounds normal. No respiratory distress. He has no wheezes. He has no rales. He exhibits no tenderness.  Abdominal: Soft. He exhibits no distension and no mass. There is no tenderness. There is no rebound and no guarding.  No focal abdominal tenderness, no RLQ tenderness or pain at McBurney's point, no RUQ tenderness or Murphy's sign, no left-sided abdominal tenderness, no fluid wave, or signs of peritonitis   Musculoskeletal: Normal range of motion. He exhibits no edema or tenderness.  Neurological: He is alert and oriented to person, place, and time.  Skin: Skin is warm and dry.  Psychiatric: He has a normal mood and affect. His behavior is normal. Judgment and thought content normal.  Nursing note and vitals reviewed.   ED Course  Procedures (including critical care time) Results for orders placed or performed during the hospital encounter  of 02/21/15  Lipase, blood  Result Value Ref Range   Lipase 43 11 - 51 U/L  Comprehensive metabolic panel  Result Value Ref Range   Sodium 139 135 - 145 mmol/L   Potassium 3.9 3.5 - 5.1 mmol/L   Chloride 105 101 - 111 mmol/L   CO2 24 22 - 32 mmol/L   Glucose, Bld 107 (H) 65 - 99 mg/dL   BUN 7 6 - 20 mg/dL    Creatinine, Ser 1.61 (H) 0.61 - 1.24 mg/dL   Calcium 9.6 8.9 - 09.6 mg/dL   Total Protein 8.0 6.5 - 8.1 g/dL   Albumin 4.5 3.5 - 5.0 g/dL   AST 35 15 - 41 U/L   ALT 44 17 - 63 U/L   Alkaline Phosphatase 84 38 - 126 U/L   Total Bilirubin 1.7 (H) 0.3 - 1.2 mg/dL   GFR calc non Af Amer >60 >60 mL/min   GFR calc Af Amer >60 >60 mL/min   Anion gap 10 5 - 15  CBC  Result Value Ref Range   WBC 9.9 4.0 - 10.5 K/uL   RBC 4.92 4.22 - 5.81 MIL/uL   Hemoglobin 15.0 13.0 - 17.0 g/dL   HCT 04.5 40.9 - 81.1 %   MCV 92.9 78.0 - 100.0 fL   MCH 30.5 26.0 - 34.0 pg   MCHC 32.8 30.0 - 36.0 g/dL   RDW 91.4 78.2 - 95.6 %   Platelets 304 150 - 400 K/uL   No results found.  I have personally reviewed and evaluated these images and lab results as part of my medical decision-making.    MDM   Final diagnoses:  Epigastric abdominal pain    Patient with epigastric abdominal pain and heartburn.  Symptoms worsen after eating spicy foods.  Suspect PUD, but patient is a heavy drinker... Could be pancreatitis.  Will check lipase and CMP.  Afebrile. Tolerating orals.  Not in any apparent distress.  5:08 PM Lipase is negative. CBC normal. No elevated LFTs. Creatinine is mildly elevated at 1.34. I have informed the patient this, and instructed him to drink more water and less alcohol. Recommend that the patient have his kidney function rechecked in 1-2 weeks by his primary care provider. Patient now feels much better after the Protonix and GI cocktail. I'm suspicious of peptic ulcer disease. Will treat the patient with Carafate and omeprazole. Recommend PCP follow-up.  Roxy Horseman, PA-C 02/21/15 1714  Courteney Randall An, MD 02/22/15 1606

## 2015-02-21 NOTE — ED Notes (Signed)
Pt with abdominal pain for "a while".  Pain has been over a month.  Pt states he thinks it is from drinking too much.  Nausea with no vomiting.  Some slight change in bm consistency.  No blood.

## 2015-02-21 NOTE — ED Notes (Signed)
Pt was provided with urinal for urine specimen

## 2015-07-30 IMAGING — US US SCROTUM
1 series · 13 of 25 positions shown · non-contrast
Comparison: None.

CLINICAL DATA: Right-sided testicular pain and swelling

EXAM:
SCROTAL ULTRASOUND
DOPPLER ULTRASOUND OF THE TESTICLES
TECHNIQUE: Complete ultrasound examination of the testicles, epididymis, and
other scrotal structures was performed. Color and spectral Doppler
ultrasound were also utilized to evaluate blood flow to the
testicles.

[Series 1: us scrotum · 0.06mm/px · 13 of 67 slices shown]
[im 1/67]
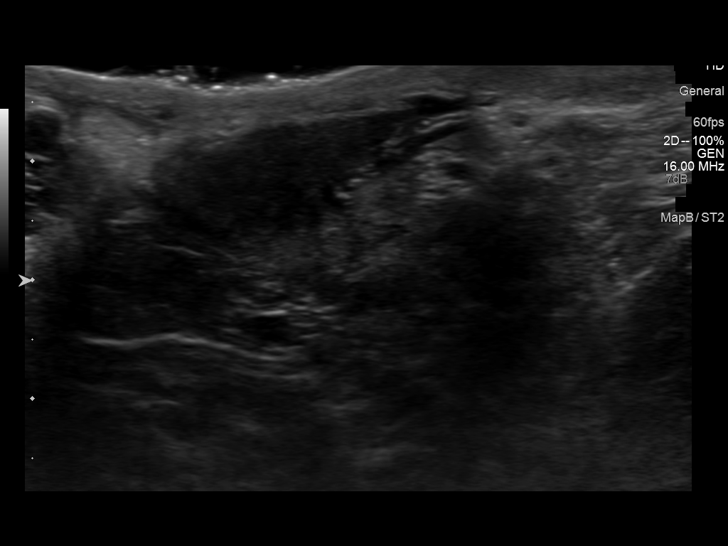
[im 6/67]
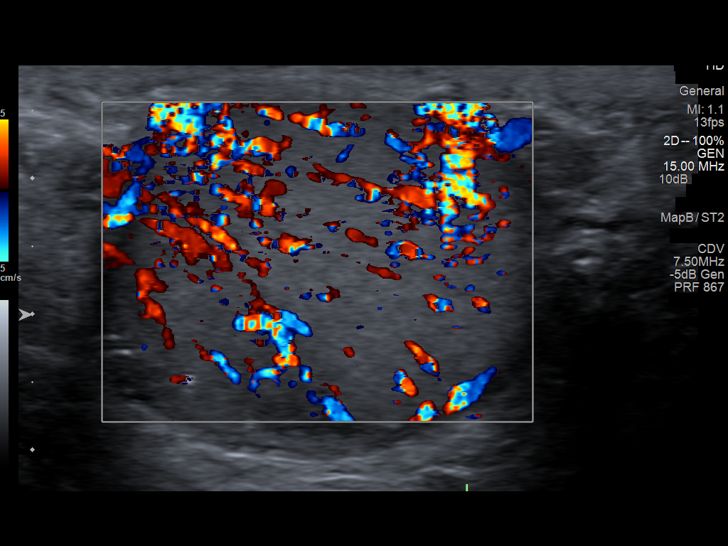
[im 12/67]
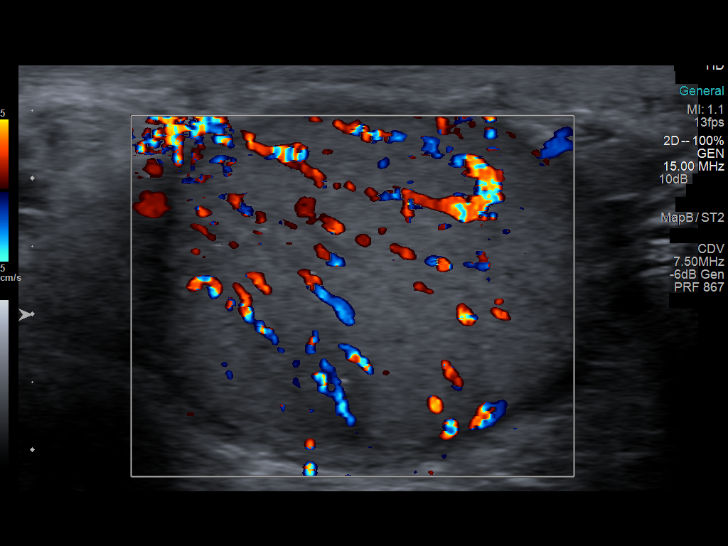
[im 17/67]
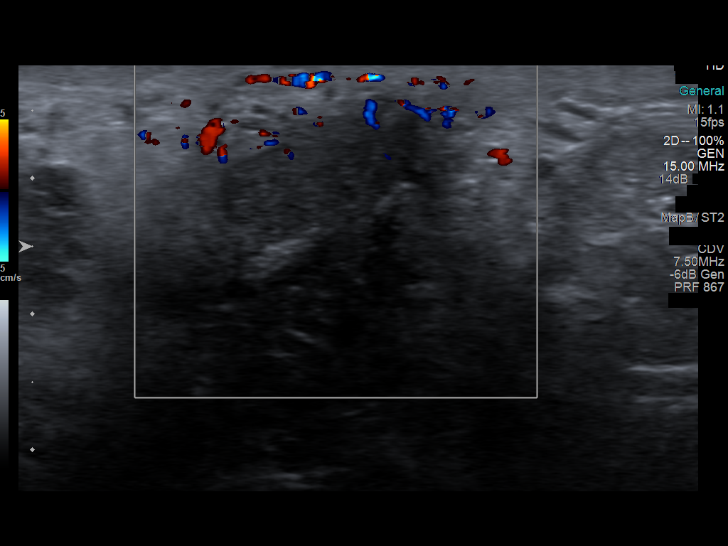
[im 23/67]
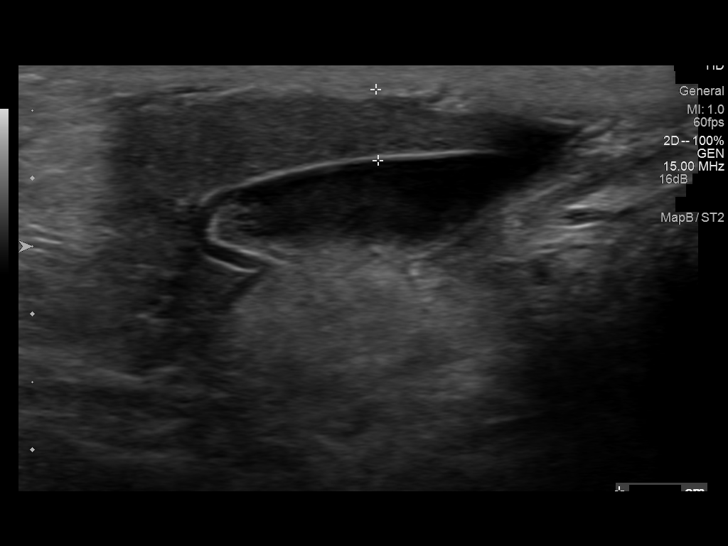
[im 28/67]
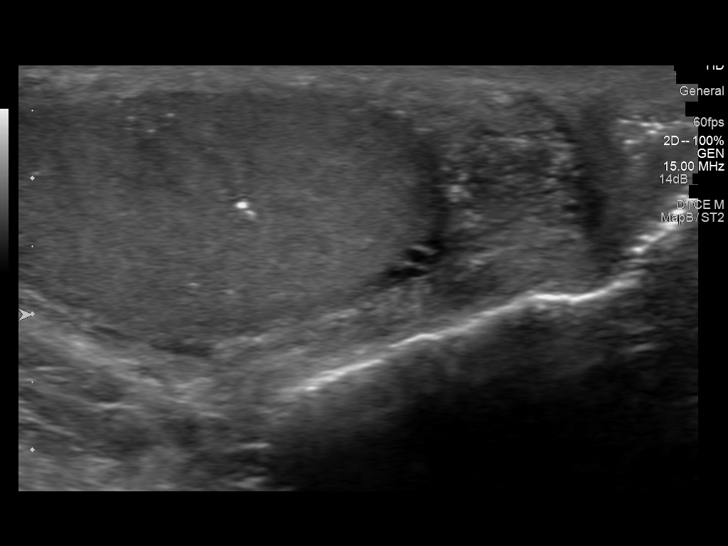
[im 34/67]
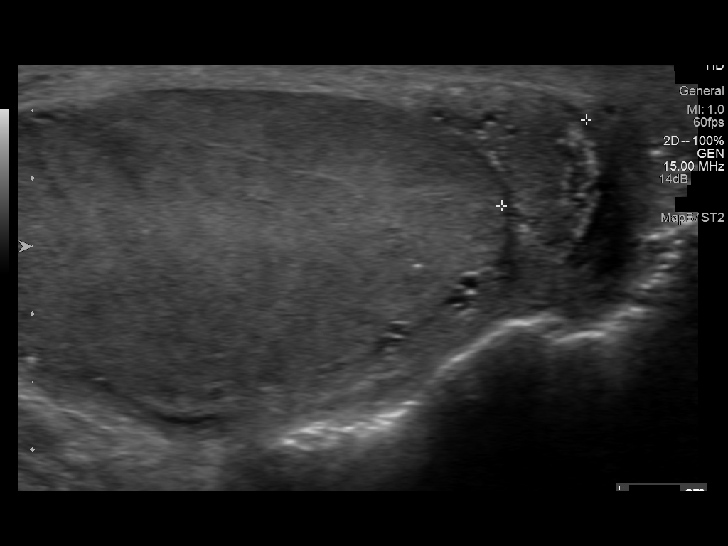
[im 39/67]
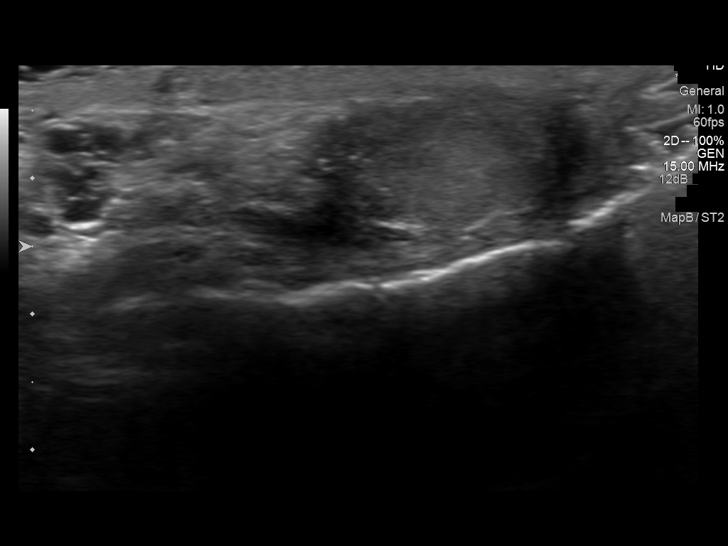
[im 45/67]
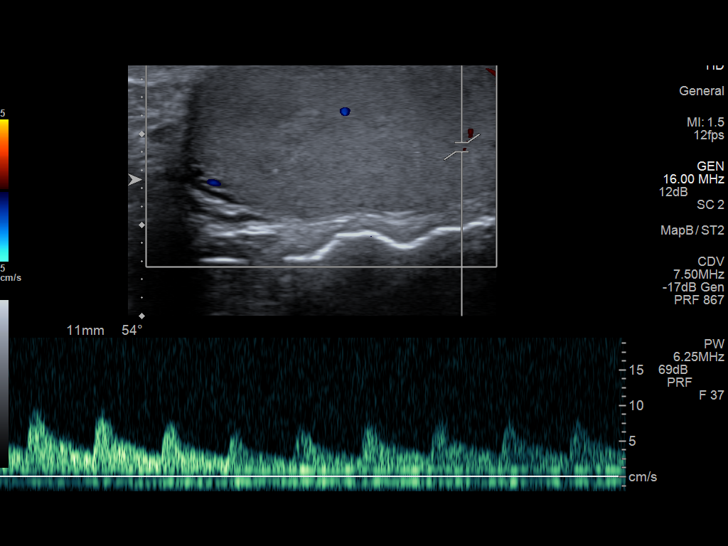
[im 50/67]
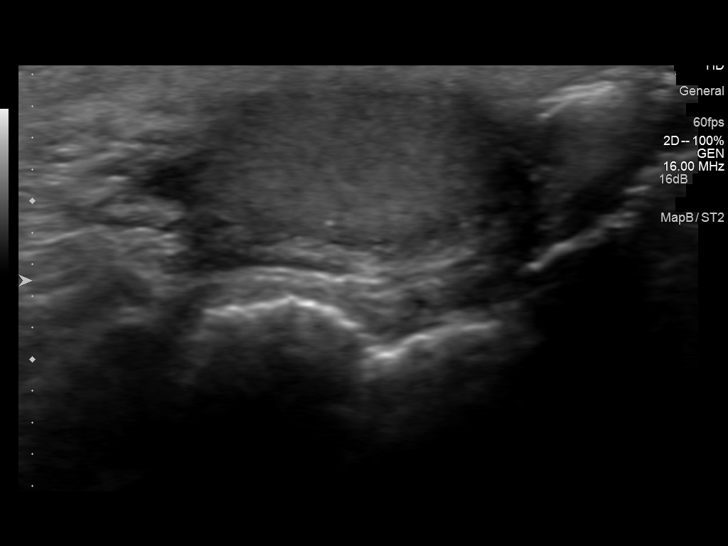
[im 56/67]
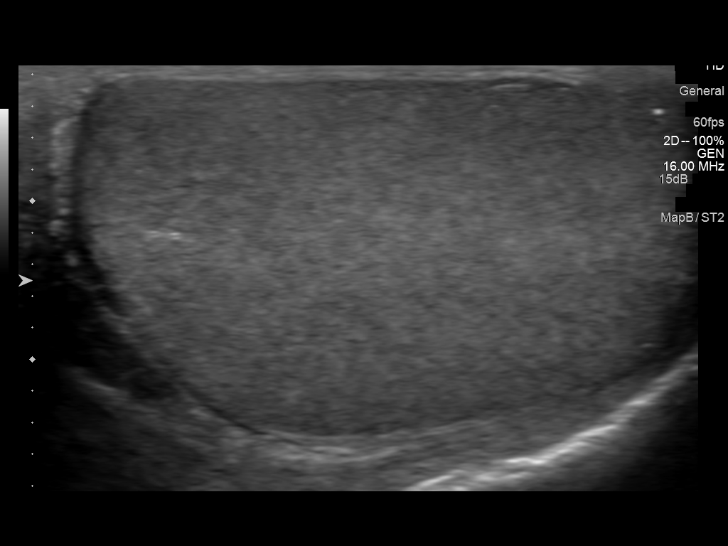
[im 61/67]
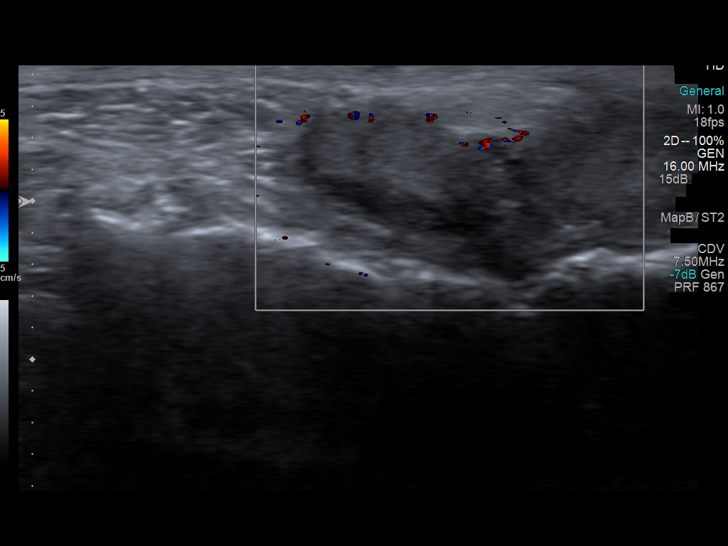
[im 67/67]
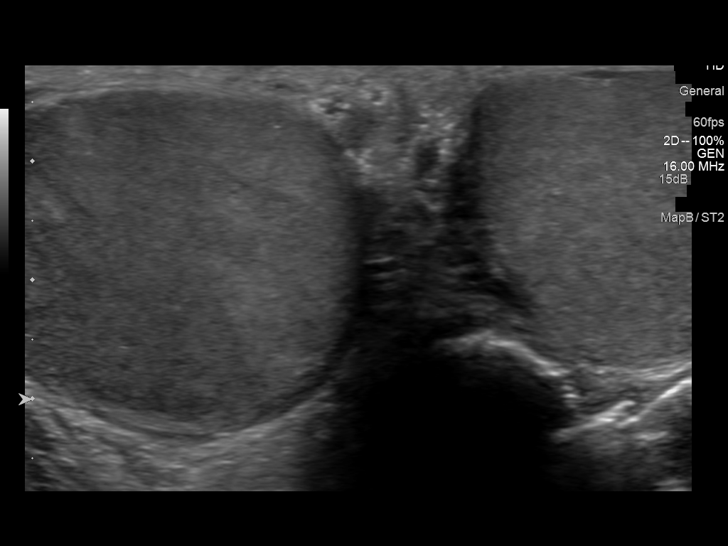

[13 of 25 positions shown; findings below may reference images not displayed]

FINDINGS: Right testicle

Measurements: 5.4 x 2.7 x 3.5 cm. No mass identified. Microlithiasis
noted.

Left testicle

Measurements: 4.1 x 2.0 x 4.0 cm. No mass identified. Microlithiasis
noted.

Right epididymis: Mild swelling and hyperemia seen involving tail of
right epididymis. This is suspicious for mild epididymitis.

Left epididymis:  Normal in size and appearance.

Hydrocele:  None visualized.

Varicocele:  Small bilateral varicoceles noted.

Pulsed Doppler interrogation of both testes demonstrates low
resistance arterial and venous waveforms bilaterally. Color Doppler
ultrasound shows increased blood flow within the right testicle
compared to the left testicle.
IMPRESSION: No evidence of testicular mass or torsion.

Findings suspicious for mild right-sided epididymitis. Possible
right-sided orchitis versus reactive hyperemia.

Bilateral testicular microlithiasis. Current literature suggests
that testicular microlithiasis is not a significant independent risk
factor for development of testicular carcinoma, and that follow up
imaging is not warranted in the absence of other risk factors.
Monthly testicular self-examination and annual physical exams are
considered appropriate surveillance. If patient has other risk
factors for testicular carcinoma, then referral to Urology should be
considered. (Reference: Al-Imran, et al.: A 5-Year Follow up Study
of Asymptomatic Men with Testicular Microlithiasis. J Urol 3220;
